# Patient Record
Sex: Female | Born: 1975 | Race: White | Hispanic: Yes | Marital: Married | Smoking: Never smoker
Health system: Southern US, Community
[De-identification: ages and names within clinical notes are randomized; demographics above are authoritative.]

---

## 2013-01-04 ENCOUNTER — Emergency Department: Payer: Self-pay | Admitting: Emergency Medicine

## 2014-09-01 HISTORY — PX: ANKLE FRACTURE SURGERY: SHX122

## 2017-02-18 ENCOUNTER — Encounter: Payer: Self-pay | Admitting: Certified Nurse Midwife

## 2017-02-19 ENCOUNTER — Encounter: Payer: Self-pay | Admitting: Certified Nurse Midwife

## 2017-08-05 ENCOUNTER — Ambulatory Visit: Payer: Self-pay | Admitting: Primary Care

## 2017-12-27 ENCOUNTER — Emergency Department
Admission: EM | Admit: 2017-12-27 | Discharge: 2017-12-27 | Disposition: A | Discharge status: Home / Self Care | Payer: BLUE CROSS/BLUE SHIELD | Attending: Emergency Medicine | Admitting: Emergency Medicine

## 2017-12-27 ENCOUNTER — Encounter: Payer: Self-pay | Admitting: Emergency Medicine

## 2017-12-27 ENCOUNTER — Other Ambulatory Visit: Payer: Self-pay

## 2017-12-27 ENCOUNTER — Emergency Department: Payer: BLUE CROSS/BLUE SHIELD

## 2017-12-27 DIAGNOSIS — Y929 Unspecified place or not applicable: Secondary | ICD-10-CM | POA: Diagnosis not present

## 2017-12-27 DIAGNOSIS — S0181XA Laceration without foreign body of other part of head, initial encounter: Secondary | ICD-10-CM | POA: Diagnosis not present

## 2017-12-27 DIAGNOSIS — S0990XA Unspecified injury of head, initial encounter: Secondary | ICD-10-CM | POA: Diagnosis present

## 2017-12-27 DIAGNOSIS — Y939 Activity, unspecified: Secondary | ICD-10-CM | POA: Diagnosis not present

## 2017-12-27 DIAGNOSIS — Y999 Unspecified external cause status: Secondary | ICD-10-CM | POA: Insufficient documentation

## 2017-12-27 DIAGNOSIS — W19XXXA Unspecified fall, initial encounter: Secondary | ICD-10-CM | POA: Insufficient documentation

## 2017-12-27 LAB — CBC
HEMATOCRIT: 35.8 % (ref 35.0–47.0)
Hemoglobin: 11.9 g/dL — ABNORMAL LOW (ref 12.0–16.0)
MCH: 28.9 pg (ref 26.0–34.0)
MCHC: 33.3 g/dL (ref 32.0–36.0)
MCV: 86.7 fL (ref 80.0–100.0)
Platelets: 280 10*3/uL (ref 150–440)
RBC: 4.13 MIL/uL (ref 3.80–5.20)
RDW: 16.9 % — ABNORMAL HIGH (ref 11.5–14.5)
WBC: 6.5 10*3/uL (ref 3.6–11.0)

## 2017-12-27 LAB — COMPREHENSIVE METABOLIC PANEL
ALT: 27 U/L (ref 14–54)
AST: 31 U/L (ref 15–41)
Albumin: 4.6 g/dL (ref 3.5–5.0)
Alkaline Phosphatase: 24 U/L — ABNORMAL LOW (ref 38–126)
Anion gap: 9 (ref 5–15)
BUN: 8 mg/dL (ref 6–20)
CALCIUM: 9.5 mg/dL (ref 8.9–10.3)
CO2: 23 mmol/L (ref 22–32)
CREATININE: 0.63 mg/dL (ref 0.44–1.00)
Chloride: 107 mmol/L (ref 101–111)
GFR calc non Af Amer: >60 mL/min (ref >60)
GLUCOSE: 109 mg/dL — AB (ref 65–99)
Potassium: 3.9 mmol/L (ref 3.5–5.1)
SODIUM: 139 mmol/L (ref 135–145)
Total Bilirubin: 0.4 mg/dL (ref 0.3–1.2)
Total Protein: 7.9 g/dL (ref 6.5–8.1)

## 2017-12-27 LAB — ETHANOL: Alcohol, Ethyl (B): 318 mg/dL (ref <10)

## 2017-12-27 MED ORDER — LIDOCAINE-EPINEPHRINE (PF) 1 %-1:200000 IJ SOLN
INTRAMUSCULAR | Status: AC
  Administered 2017-12-27: 30 mL
  Filled 2017-12-27: qty 30

## 2017-12-27 MED ORDER — ACETAMINOPHEN 500 MG PO TABS
1000.0000 mg | ORAL_TABLET | Freq: Once | ORAL | Status: AC
Start: 2017-12-27 — End: 2017-12-27
  Administered 2017-12-27: 1000 mg via ORAL
  Filled 2017-12-27: qty 2

## 2017-12-27 MED ORDER — SODIUM CHLORIDE 0.9 % IV BOLUS
1000.0000 mL | Freq: Once | INTRAVENOUS | Status: AC
  Administered 2017-12-27: 1000 mL via INTRAVENOUS

## 2017-12-27 MED ORDER — BACITRACIN ZINC 500 UNIT/GM EX OINT
TOPICAL_OINTMENT | Freq: Once | CUTANEOUS | Status: AC
  Administered 2017-12-27: 1 via TOPICAL
  Filled 2017-12-27: qty 0.9

## 2017-12-27 NOTE — ED Notes (Signed)
Patient transported to CT 

## 2017-12-27 NOTE — ED Notes (Signed)
Laurie Buchanan, sig other, given earrings and NCDL

## 2017-12-27 NOTE — Discharge Instructions (Signed)
Please follow up with your physician or return to have your sutures removed in 5 days. Please take tylenol or ibuprofen for pain and please refrain from excessive drinking
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
## 2017-12-27 NOTE — ED Triage Notes (Signed)
Patient presents to Emergency Department via EMS with complaints of head laceration.    Pt reports having "a lot" of ETOH tonight.    Pt with severe lac to right mid forehead - approx 12x5cm, bleeding controlled and bandaged.

## 2017-12-27 NOTE — ED Notes (Signed)
Pt ambulated out of department with friends. Refused wheelchair.

## 2018-08-30 IMAGING — CT CT CERVICAL SPINE W/O CM
4 of 7 series · 13 of 33 positions shown, 14 images · non-contrast
Comparison: None.

CLINICAL DATA: Laceration to the right mid forehead. Intoxicated.
Unsure of loss of consciousness or how patient fell.

EXAM:
CT HEAD WITHOUT CONTRAST
CT CERVICAL SPINE WITHOUT CONTRAST
TECHNIQUE: Multidetector CT imaging of the head and cervical spine was
performed following the standard protocol without intravenous
contrast. Multiplanar CT image reconstructions of the cervical spine
were also generated.

[Series 7: c spine soft · axial · 0.32mm/px · z∈[-257,-145]mm · 4 of 94 slices shown]
[im 19/94  soft-tissue]
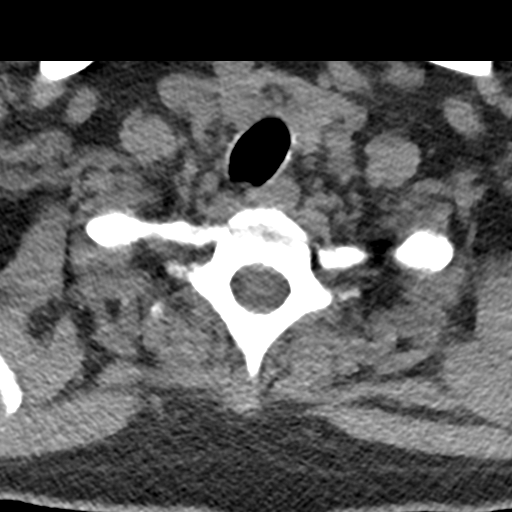
[im 38/94  soft-tissue]
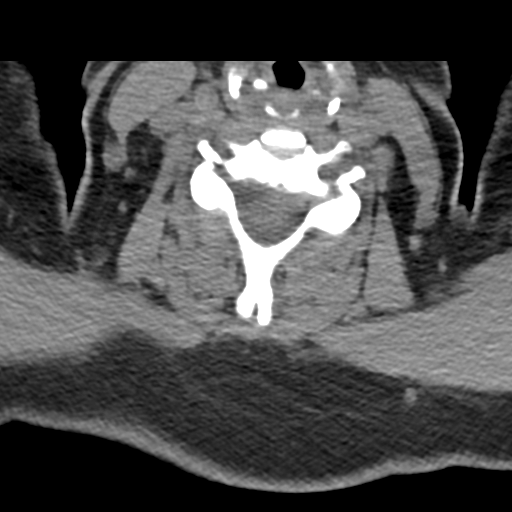
[im 56/94  soft-tissue]
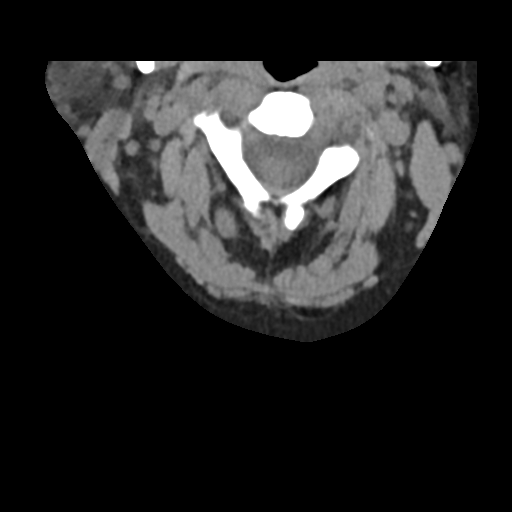
[im 75/94  soft-tissue]
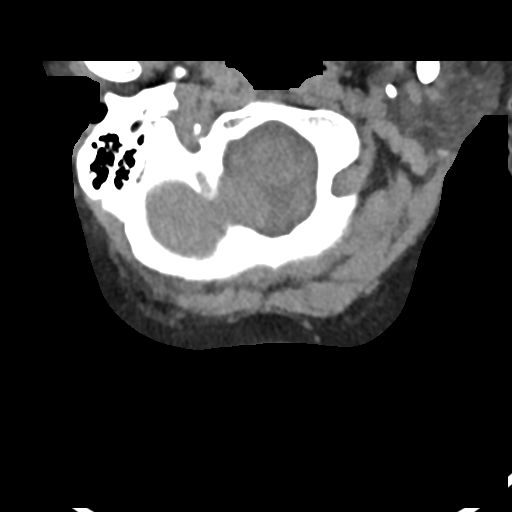

[Series 10: sagittal bone · sagittal · 0.22mm/px · 4 of 46 slices shown]
[im 10/46  bone]
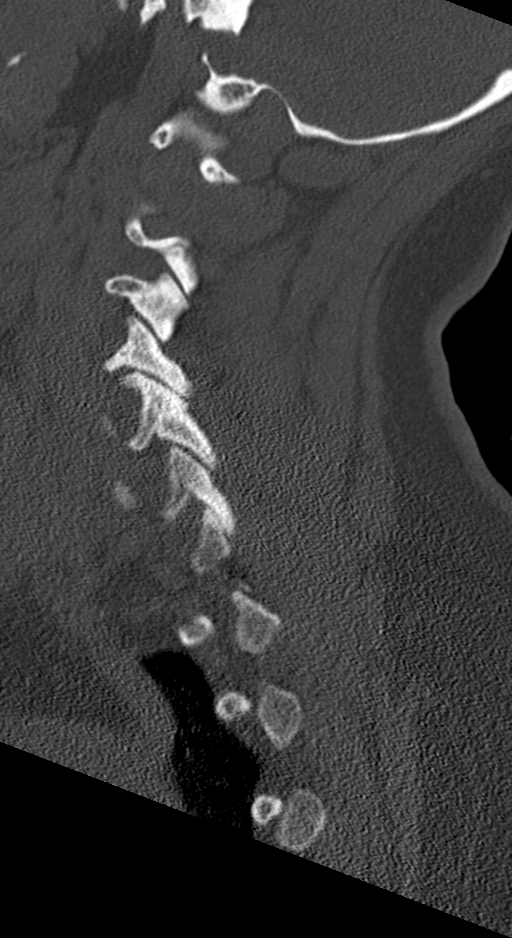
[im 19/46  bone]
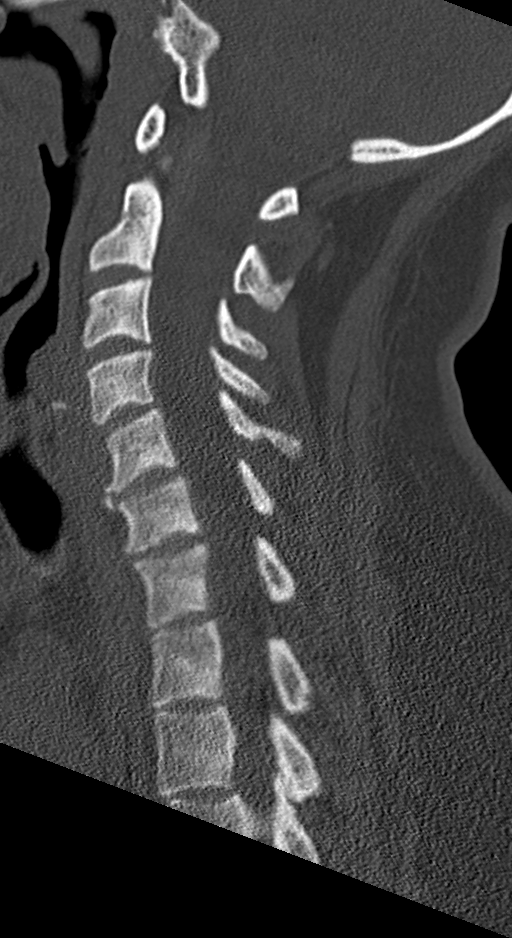
[im 28/46  bone]
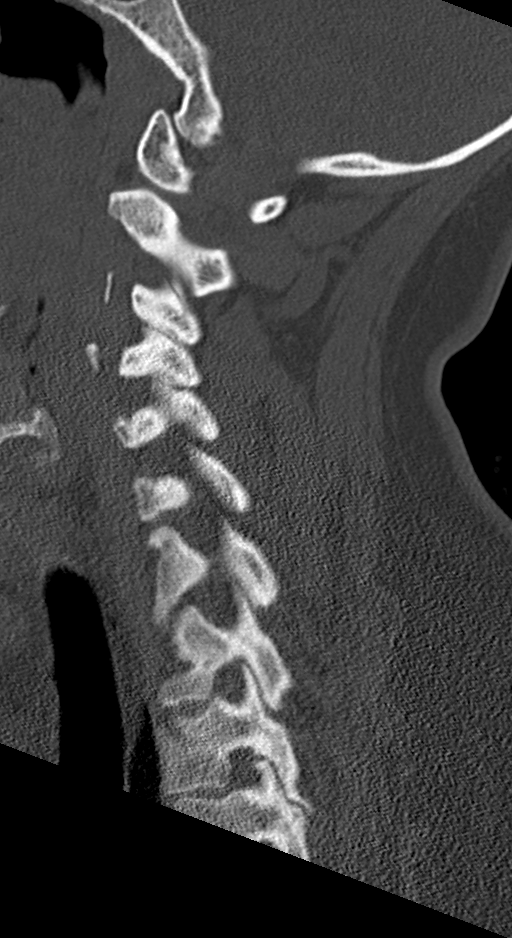
[im 37/46  bone]
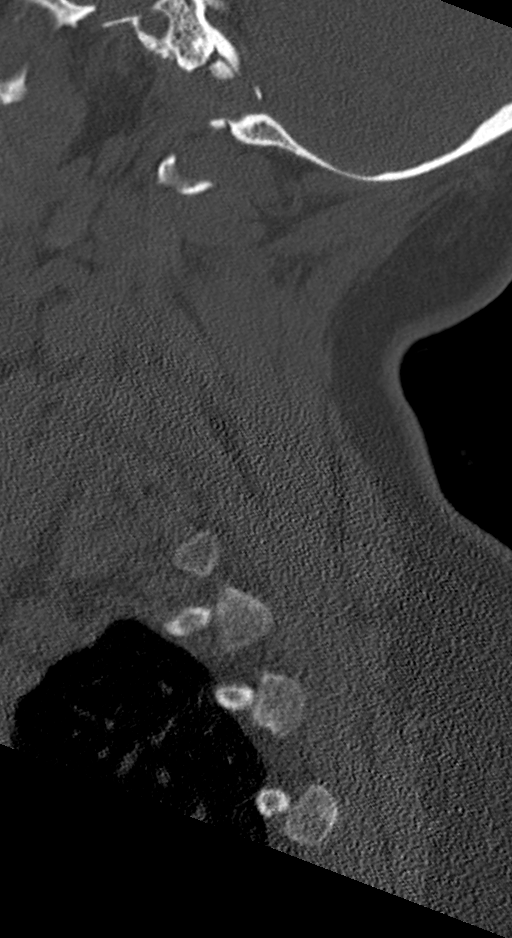

[Series 11: coronal bone · coronal · 0.26mm/px · 1 of 49 slices shown]
[im 25/49  bone]
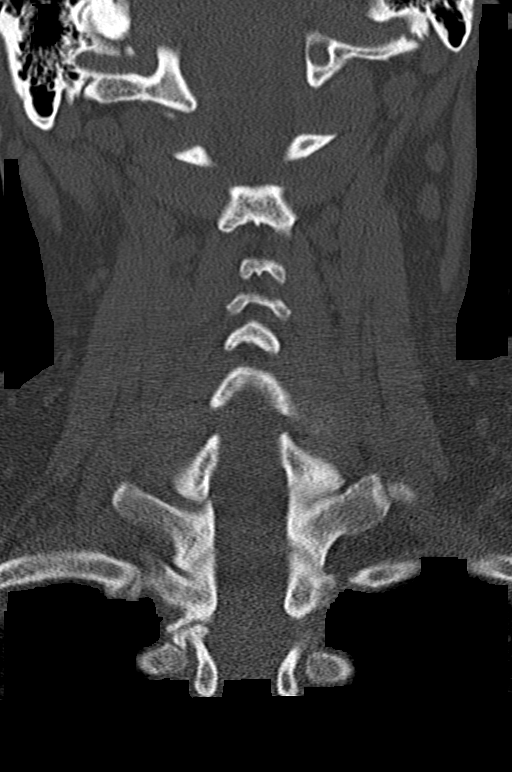

[Series 12: orthogonal bone · axial · 0.24mm/px · z∈[-297,-185]mm · 4 of 102 slices shown, 5 images]
[im 21/102  soft-tissue]
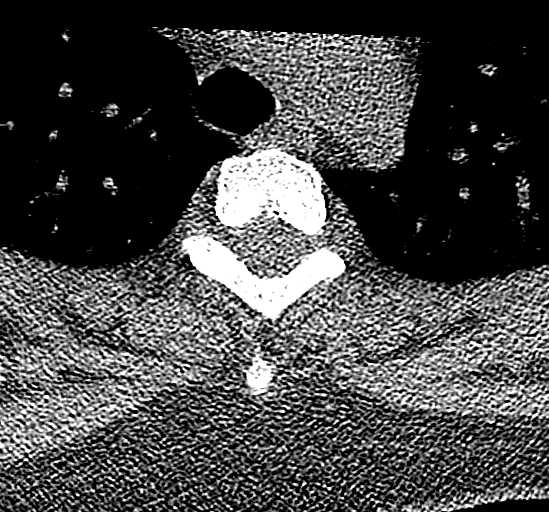
[im 21/102  bone]
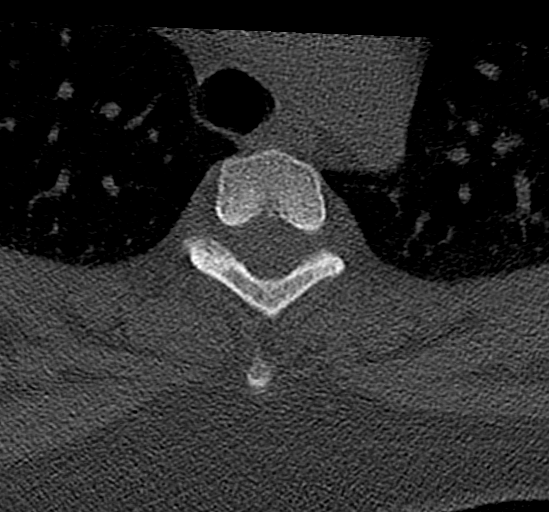
[im 41/102  bone]
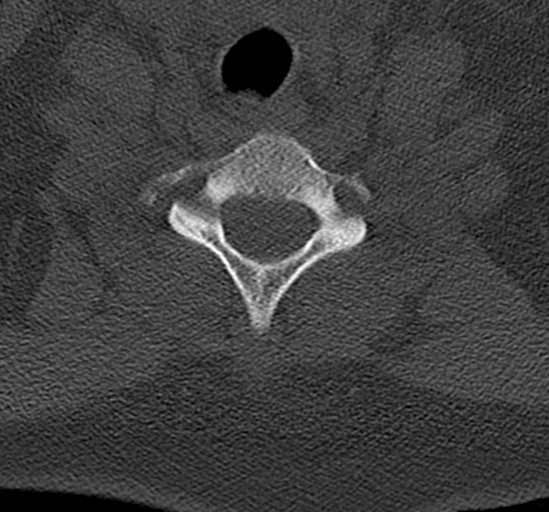
[im 61/102  bone]
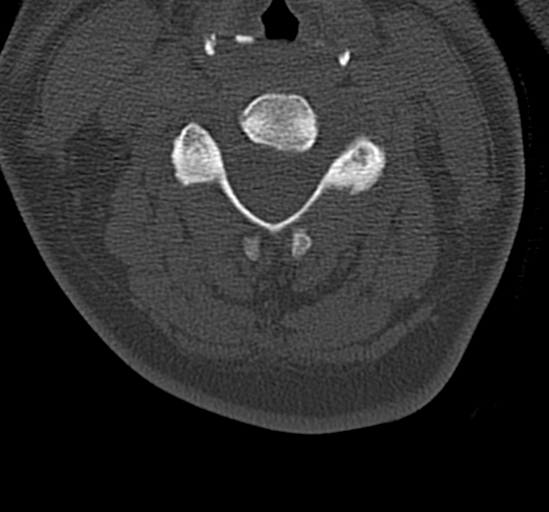
[im 81/102  bone]
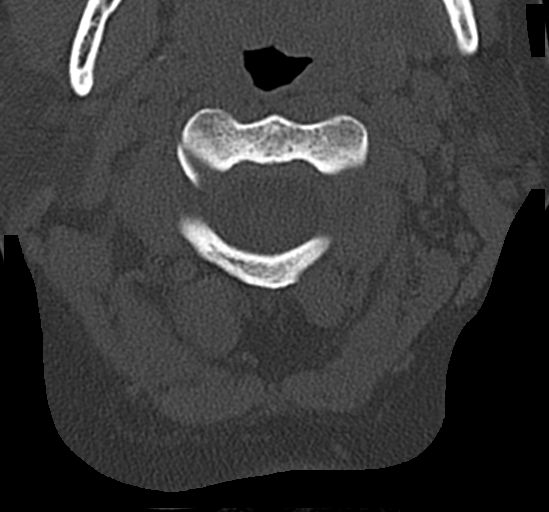

[13 of 33 positions shown; findings below may reference images not displayed]

FINDINGS: CT HEAD FINDINGS

Brain: No evidence of acute infarction, hemorrhage, hydrocephalus,
extra-axial collection or mass lesion/mass effect.

Vascular: No hyperdense vessel or unexpected calcification.

Skull: Normal. Negative for fracture or focal lesion.

Sinuses/Orbits: Paranasal sinuses and mastoid air cells are clear.

Other: Laceration and hematoma in the scalp over the right anterior
frontal region and extending to the supraorbital region.
Subcutaneous emphysema in the supraorbital region with focal
extension to the extraconal superior orbit. No retrobulbar
involvement.

CT CERVICAL SPINE FINDINGS

Alignment: Normal.

Skull base and vertebrae: No acute fracture. No primary bone lesion
or focal pathologic process.

Soft tissues and spinal canal: No prevertebral fluid or swelling. No
visible canal hematoma.

Disc levels: Degenerative changes with narrowed interspace levels
and endplate hypertrophic changes most prominent at C3-4 and C5-6.
Mild degenerative changes in the facet joints.

Upper chest: Negative.

Other: None.
IMPRESSION: 1. No acute intracranial abnormalities.
2. Laceration and hematoma in the right anterior frontal scalp
extending to the supraorbital region. Subcutaneous emphysema in the
supraorbital region with focal extension to the extraconal superior
orbit. No retrobulbar involvement.
3. Normal alignment of the cervical spine. Mild degenerative
changes. No acute displaced fractures identified.

## 2019-04-21 ENCOUNTER — Other Ambulatory Visit: Payer: Self-pay

## 2019-04-21 DIAGNOSIS — Z20822 Contact with and (suspected) exposure to covid-19: Secondary | ICD-10-CM

## 2019-04-22 LAB — NOVEL CORONAVIRUS, NAA: SARS-CoV-2, NAA: NOT DETECTED

## 2019-11-11 ENCOUNTER — Ambulatory Visit: Payer: Self-pay | Attending: Internal Medicine

## 2019-11-11 DIAGNOSIS — Z23 Encounter for immunization: Secondary | ICD-10-CM

## 2019-11-11 NOTE — Progress Notes (Signed)
   Covid-19 Vaccination Clinic  Name:  Laurie Buchanan    MRN: [id number] DOB: 05/09/1976  11/11/2019  Laurie Buchanan was observed post Covid-19 immunization for 15 minutes without incident. She was provided with Vaccine Information Sheet and instruction to access the V-Safe system.   Laurie Buchanan was instructed to call 911 with any severe reactions post vaccine: Marland Kitchen Difficulty breathing  . Swelling of face and throat  . A fast heartbeat  . A bad rash all over body  . Dizziness and weakness   Immunizations Administered    Name Date Dose VIS Date Route   Moderna COVID-19 Vaccine 11/11/2019  2:59 AM 0.5 mL 08/02/2019 Intramuscular   Manufacturer: Moderna   Lot: [id number]   NDC: 96789-381-01

## 2019-12-13 ENCOUNTER — Ambulatory Visit: Payer: Self-pay | Attending: Internal Medicine

## 2019-12-13 ENCOUNTER — Ambulatory Visit: Payer: Self-pay

## 2019-12-13 DIAGNOSIS — Z23 Encounter for immunization: Secondary | ICD-10-CM

## 2019-12-13 NOTE — Progress Notes (Signed)
   Covid-19 Vaccination Clinic  Name:  Laurie Buchanan    MRN: [id number] DOB: August 05, 1976  12/13/2019  [name] was observed post Covid-19 immunization for 15 minutes without incident. She was provided with Vaccine Information Sheet and instruction to access the V-Safe system.   [name] was instructed to call 911 with any severe reactions post vaccine: Marland Kitchen Difficulty breathing  . Swelling of face and throat  . A fast heartbeat  . A bad rash all over body  . Dizziness and weakness   Immunizations Administered    Name Date Dose VIS Date Route   Moderna COVID-19 Vaccine 12/13/2019  2:03 PM 0.5 mL 08/02/2019 Intramuscular   Manufacturer: Moderna   Lot: [id number]   NDC: 79810-254-86

## 2019-12-25 ENCOUNTER — Emergency Department: Payer: Self-pay

## 2019-12-25 ENCOUNTER — Emergency Department
Admission: EM | Admit: 2019-12-25 | Discharge: 2019-12-25 | Disposition: A | Discharge status: Home / Self Care | Payer: Self-pay | Attending: Emergency Medicine | Admitting: Emergency Medicine

## 2019-12-25 ENCOUNTER — Other Ambulatory Visit: Payer: Self-pay

## 2019-12-25 DIAGNOSIS — M79662 Pain in left lower leg: Secondary | ICD-10-CM | POA: Insufficient documentation

## 2019-12-25 DIAGNOSIS — L52 Erythema nodosum: Secondary | ICD-10-CM | POA: Insufficient documentation

## 2019-12-25 MED ORDER — NAPROXEN 500 MG PO TABS: 500.0000 mg | ORAL_TABLET | Freq: Two times a day (BID) | ORAL | 2 refills | Status: DC

## 2019-12-25 NOTE — ED Provider Notes (Signed)
[facility] Emergency Department Provider Note   ____________________________________________    I have reviewed the triage vital signs and the nursing notes.   HISTORY  Chief Complaint Leg Pain     HPI Laurie Buchanan is a 44 y.o. female who presents with bilateral painful red areas to her anterior lower legs.  Patient reports this started several days ago.  She reports she injured her shin nearly a week ago.  She did get a second of her Covid vaccines 2 weeks ago.  No new medications.  She describes that her legs are slightly uncomfortable to walk on but when she gets going they feel better.  She has not take anything for this.  No fevers or chills.  Was seen in urgent care, sent to the emergency department for evaluation of possible blood clots.  History reviewed. No pertinent past medical history.  There are no problems to display for this patient.   Past Surgical History:  Procedure Laterality Date  . ANKLE FRACTURE SURGERY Right 2016    Prior to Admission medications   Medication Sig Start Date End Date Taking? Authorizing Provider  naproxen (NAPROSYN) 500 MG tablet Take 1 tablet (500 mg total) by mouth 2 (two) times daily with a meal. 12/25/19   Jene Every, MD     Allergies Patient has no known allergies.  No family history on file.  Social History Social History   Tobacco Use  . Smoking status: Never Smoker  . Smokeless tobacco: Never Used  Substance Use Topics  . Alcohol use: Yes  . Drug use: Not Currently    Review of Systems  Constitutional: No fever/chills  ENT: No sore throat.   Gastrointestinal: No abdominal pain.  No nausea, no vomiting.   Genitourinary: Negative for dysuria. Musculoskeletal: As above Skin: As above Neurological: Negative for headaches     ____________________________________________   PHYSICAL EXAM:  VITAL SIGNS: ED Triage Vitals  Enc Vitals Group     BP 12/25/19 1156 (!) 143/91      Pulse Rate 12/25/19 1156 97     Resp 12/25/19 1156 16     Temp 12/25/19 1156 98.3 F (36.8 C)     Temp Source 12/25/19 1156 Oral     SpO2 12/25/19 1156 99 %     Weight 12/25/19 1147 95.3 kg (210 lb)     Height 12/25/19 1147 1.651 m (5\' 5" )     Head Circumference --      Peak Flow --      Pain Score 12/25/19 1146 7     Pain Loc --      Pain Edu? --      Excl. in GC? --      Constitutional: Alert and oriented. No acute distress. Eyes: Conjunctivae are normal.  Head: Atraumatic. Nose: No congestion/rhinnorhea. Mouth/Throat: Mucous membranes are moist.   Cardiovascular: Normal rate, regular rhythm.  Respiratory: Normal respiratory effort.  No retractions. Genitourinary: deferred Musculoskeletal: Anterior shins bilaterally: Several erythematous circular appearing lesions, not confluence, not consistent with cellulitis, mildly tender.  Mild diffuse swelling of the calf.  Warm and well perfused Neurologic:  Normal speech and language. No gross focal neurologic deficits are appreciated.   Skin:  Skin is warm, dry and intact.  See above   ____________________________________________   LABS (all labs ordered are listed, but only abnormal results are displayed)  Labs Reviewed - No data to display ____________________________________________  EKG   ____________________________________________  RADIOLOGY  Ultrasound negative for  DVT ____________________________________________   PROCEDURES  Procedure(s) performed: No  Procedures   Critical Care performed: No ____________________________________________   INITIAL IMPRESSION / ASSESSMENT AND PLAN / ED COURSE  Pertinent labs & imaging results that were available during my care of the patient were reviewed by me and considered in my medical decision making (see chart for details).  Patient's exam is most consistent with erythema nodosum.  Considered cellulitis however not confluent and bilateral nature makes it very  unlikely.  Patient is afebrile.  Recommend supportive care primarily, have asked the patient to follow-up with PCP for further evaluation.  Return to the emergency department if worsening symptoms.   ____________________________________________   FINAL CLINICAL IMPRESSION(S) / ED DIAGNOSES  Final diagnoses:  Erythema nodosum      NEW MEDICATIONS STARTED DURING THIS VISIT:  There are no discharge medications for this patient.    Note:  This document was prepared using Dragon voice recognition software and may include unintentional dictation errors.   Lavonia Drafts, MD 12/25/19 1453

## 2019-12-25 NOTE — ED Notes (Signed)
See triage note, pt to ED for pain/redness to bilateral lower extremities since Friday. Reports going to urgent care today and being sent to ED.  Redness and heat noted upon palpitation to BLE .  Reports increased pain with ambulation

## 2019-12-25 NOTE — ED Triage Notes (Signed)
Pt reports that she was sent here by urgent care for redness/swelling/warmth to bilat lower extremities x1 week that has increasingly gotten worse over the past 2 days

## 2019-12-25 NOTE — ED Notes (Signed)
Pt signed paper copy for d/c. Denies questions or concerns at this time  

## 2020-08-27 IMAGING — US US EXTREM LOW VENOUS
1 series · 14 of 24 positions shown · non-contrast
Comparison: None

CLINICAL DATA: Bilateral leg pain and swelling.

EXAM:
Bilateral LOWER EXTREMITY VENOUS DOPPLER ULTRASOUND
TECHNIQUE: Gray-scale sonography with compression, as well as color and duplex
ultrasound, were performed to evaluate the deep venous system(s)
from the level of the common femoral vein through the popliteal and
proximal calf veins.

[Series 1: us extrem low venous · 0.09mm/px · 14 of 62 slices shown]
[im 1/62]
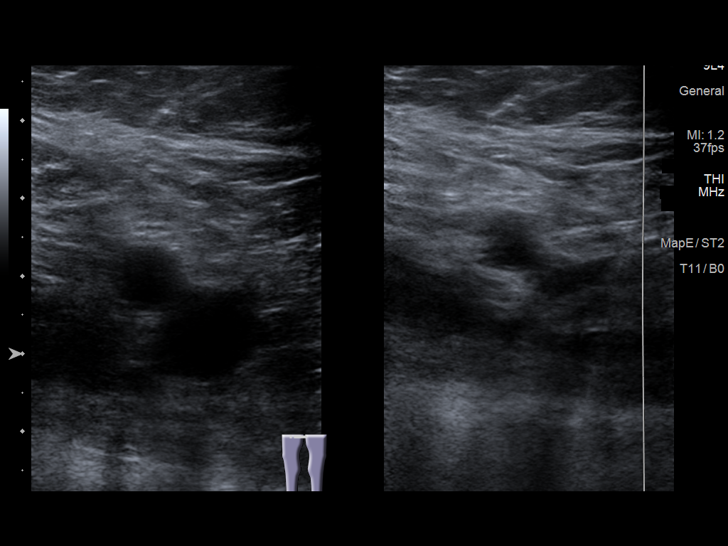
[im 6/62]
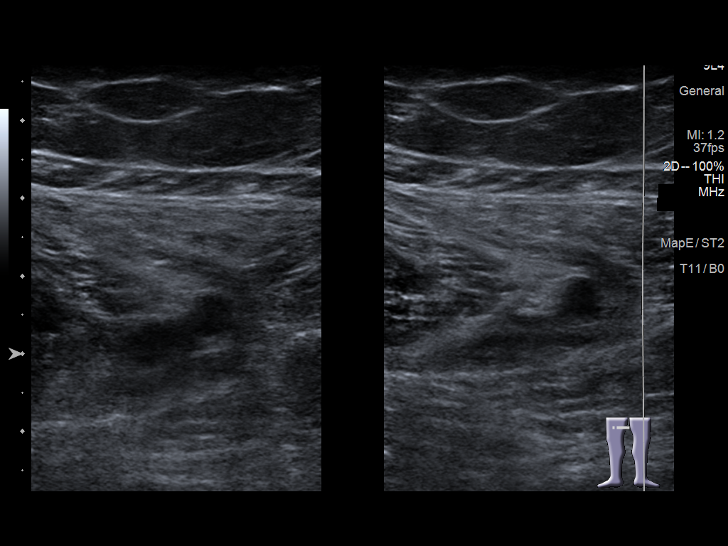
[im 11/62]
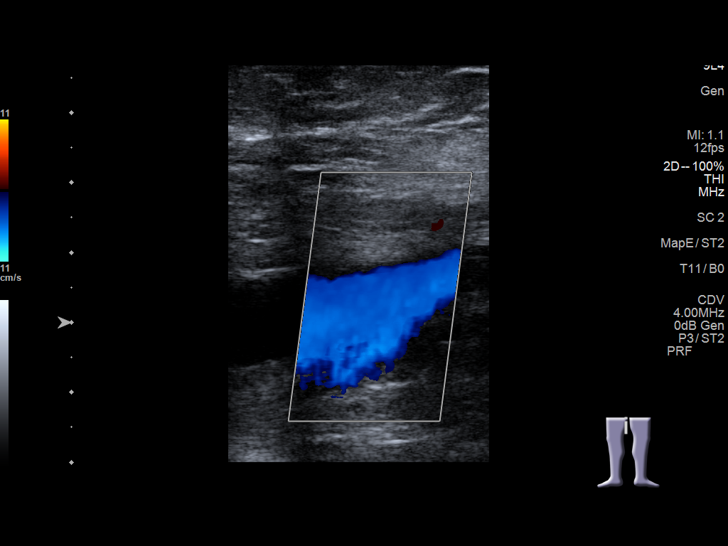
[im 16/62]
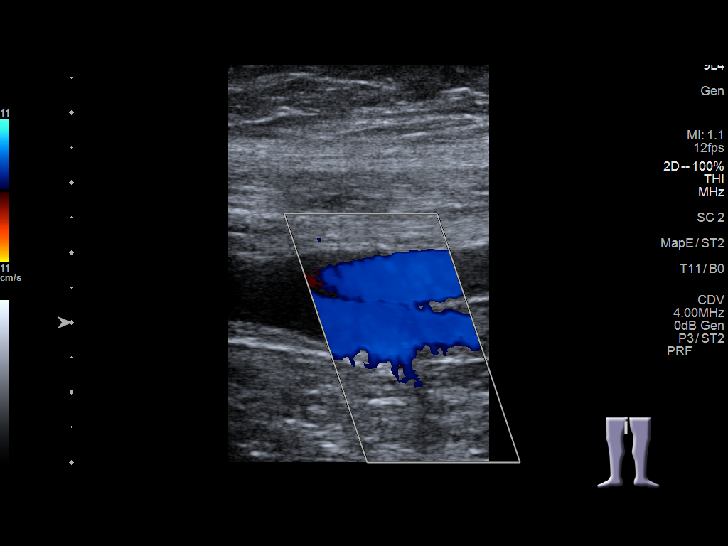
[im 19/62]
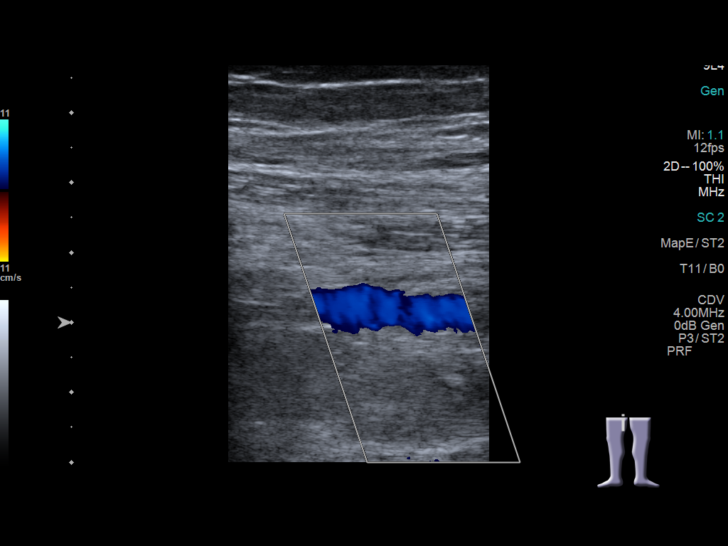
[im 24/62]
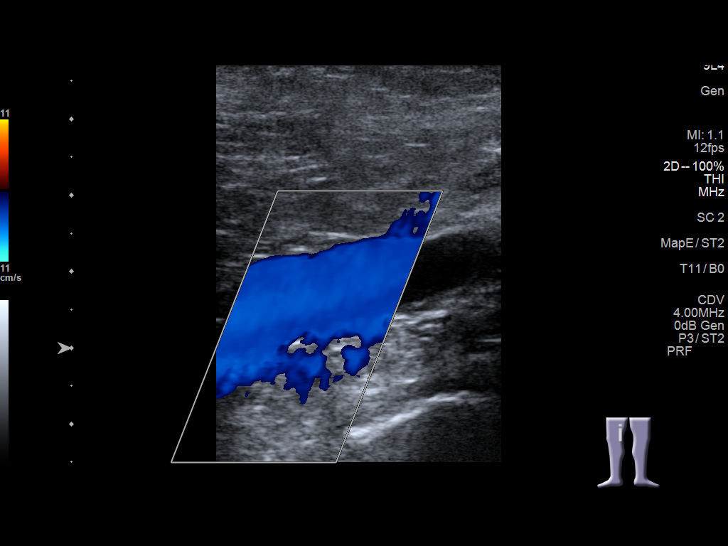
[im 30/62]
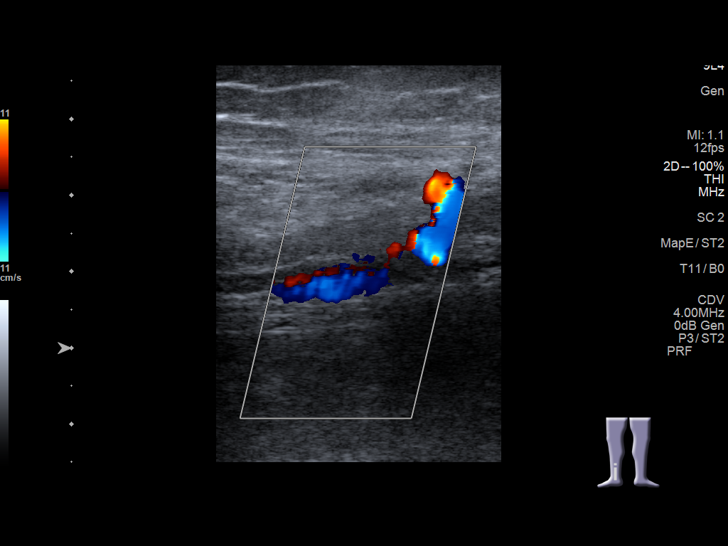
[im 32/62]
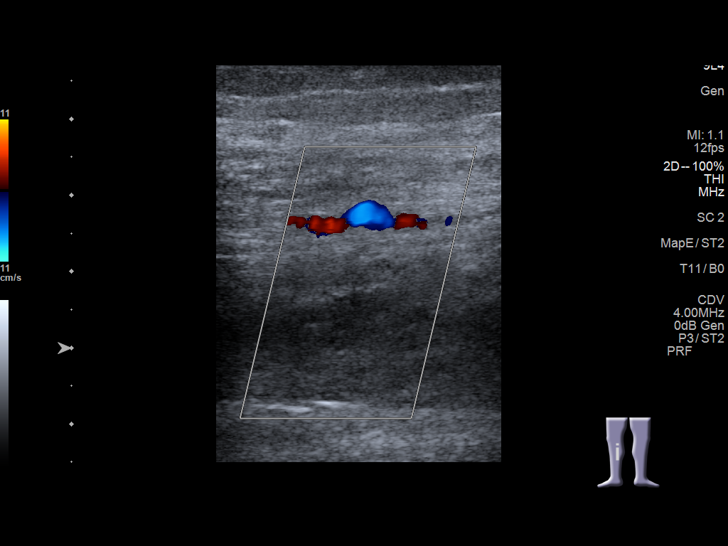
[im 38/62]
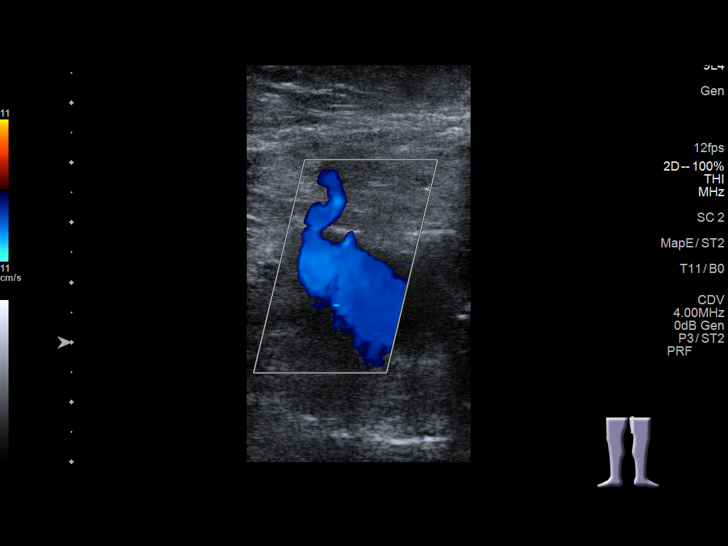
[im 43/62]
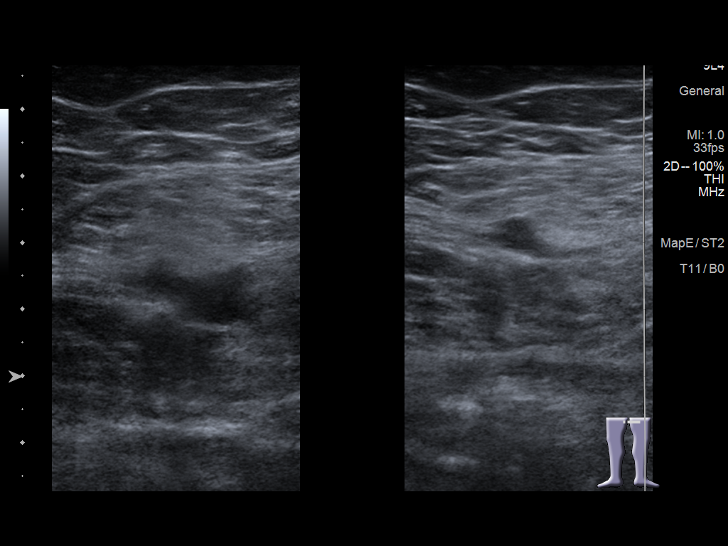
[im 48/62]
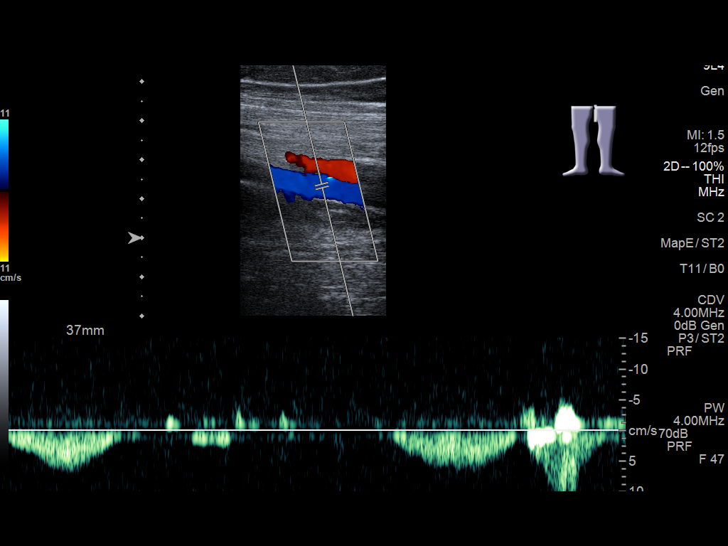
[im 51/62]
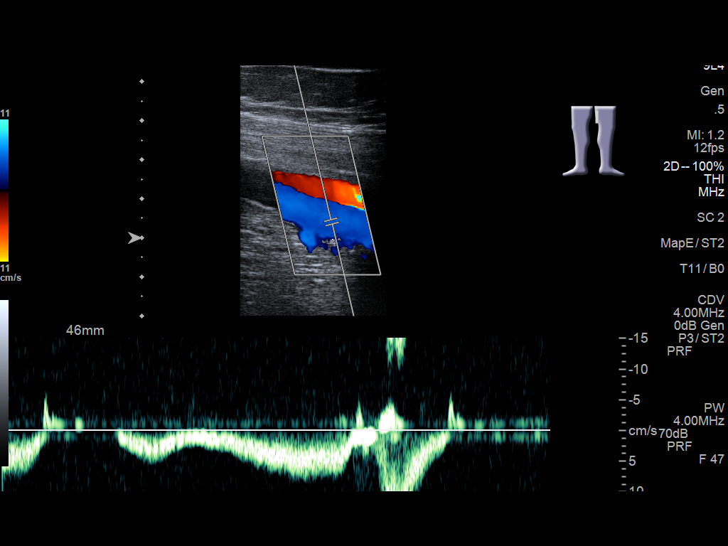
[im 56/62]
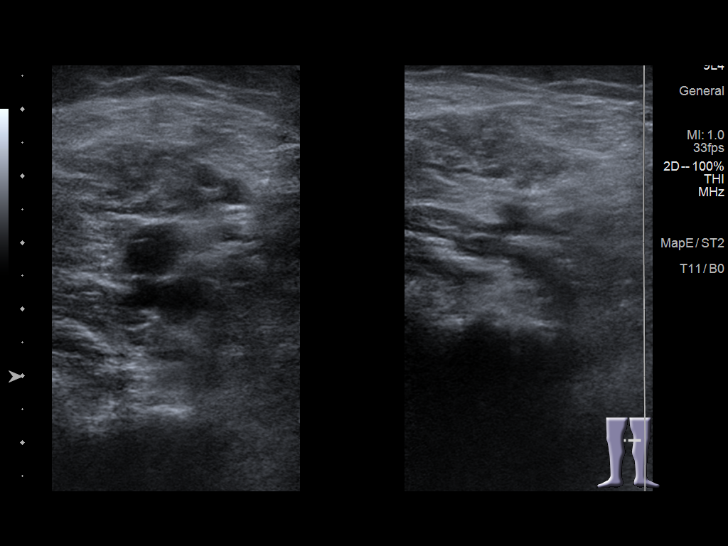
[im 62/62]
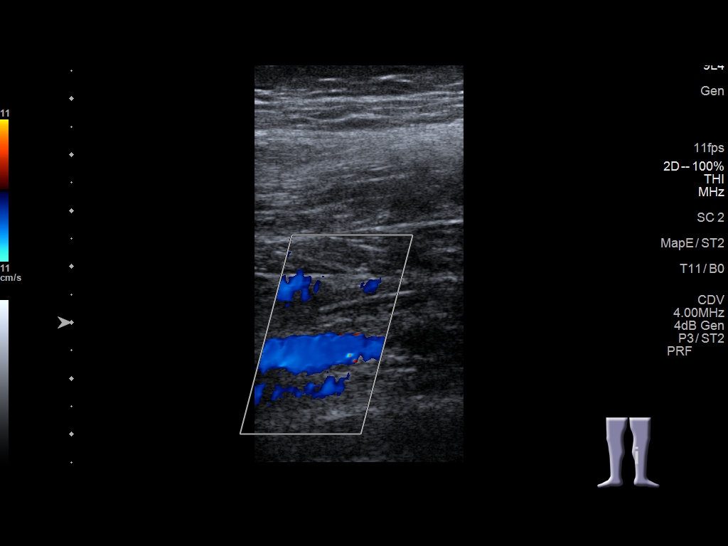

[14 of 24 positions shown; findings below may reference images not displayed]

FINDINGS: VENOUS

Normal compressibility of the common femoral, superficial femoral,
and popliteal veins, as well as the visualized calf veins.
Visualized portions of profunda femoral vein and great saphenous
vein unremarkable. No filling defects to suggest DVT on grayscale or
color Doppler imaging. Doppler waveforms show normal direction of
venous flow, normal respiratory phasicity and response to
augmentation.

OTHER

Lower extremity edema.

Sonographic assessment limited by the extent of edema.

Limitations: none
IMPRESSION: No femoropopliteal DVT nor evidence of DVT within the visualized
calf veins.

If clinical symptoms are inconsistent or if there are persistent or
worsening symptoms, further imaging (possibly involving the iliac
veins) may be warranted.

## 2021-11-13 DIAGNOSIS — J029 Acute pharyngitis, unspecified: Secondary | ICD-10-CM | POA: Diagnosis not present

## 2021-11-13 DIAGNOSIS — J309 Allergic rhinitis, unspecified: Secondary | ICD-10-CM | POA: Diagnosis not present

## 2022-05-16 ENCOUNTER — Emergency Department: Payer: 59

## 2022-05-16 ENCOUNTER — Other Ambulatory Visit: Payer: Self-pay

## 2022-05-16 ENCOUNTER — Emergency Department
Admission: EM | Admit: 2022-05-16 | Discharge: 2022-05-16 | Disposition: A | Discharge status: Home / Self Care | Payer: 59 | Attending: Student in an Organized Health Care Education/Training Program | Admitting: Student in an Organized Health Care Education/Training Program

## 2022-05-16 DIAGNOSIS — R519 Headache, unspecified: Secondary | ICD-10-CM | POA: Insufficient documentation

## 2022-05-16 DIAGNOSIS — S20212A Contusion of left front wall of thorax, initial encounter: Secondary | ICD-10-CM

## 2022-05-16 DIAGNOSIS — M5010 Cervical disc disorder with radiculopathy, unspecified cervical region: Secondary | ICD-10-CM | POA: Diagnosis not present

## 2022-05-16 DIAGNOSIS — Y92410 Unspecified street and highway as the place of occurrence of the external cause: Secondary | ICD-10-CM | POA: Insufficient documentation

## 2022-05-16 DIAGNOSIS — S299XXA Unspecified injury of thorax, initial encounter: Secondary | ICD-10-CM | POA: Diagnosis not present

## 2022-05-16 DIAGNOSIS — Z041 Encounter for examination and observation following transport accident: Secondary | ICD-10-CM | POA: Diagnosis not present

## 2022-05-16 DIAGNOSIS — S0990XA Unspecified injury of head, initial encounter: Secondary | ICD-10-CM | POA: Diagnosis not present

## 2022-05-16 DIAGNOSIS — M47812 Spondylosis without myelopathy or radiculopathy, cervical region: Secondary | ICD-10-CM | POA: Diagnosis not present

## 2022-05-16 DIAGNOSIS — R079 Chest pain, unspecified: Secondary | ICD-10-CM | POA: Diagnosis not present

## 2022-05-16 MED ORDER — IBUPROFEN 600 MG PO TABS
600.0000 mg | ORAL_TABLET | Freq: Once | ORAL | Status: AC
  Administered 2022-05-16: 600 mg via ORAL
  Filled 2022-05-16: qty 1

## 2022-05-16 MED ORDER — LIDOCAINE 5 % EX PTCH
1.0000 | MEDICATED_PATCH | CUTANEOUS | Status: DC
  Administered 2022-05-16: 1 via TRANSDERMAL
  Filled 2022-05-16: qty 1

## 2022-05-16 MED ORDER — PREDNISONE 10 MG (21) PO TBPK: ORAL_TABLET | ORAL | 0 refills | Status: DC

## 2022-05-16 NOTE — Discharge Instructions (Signed)
You may take the medication as prescribed to help with your symptoms.  Please return if you develop numbness, tingling, weakness in your arms, or worsening pain.  Please follow-up with your outpatient provider.  It is a pleasure caring for you today.
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
## 2022-09-23 DIAGNOSIS — Z6834 Body mass index (BMI) 34.0-34.9, adult: Secondary | ICD-10-CM | POA: Diagnosis not present

## 2022-09-23 DIAGNOSIS — K130 Diseases of lips: Secondary | ICD-10-CM | POA: Diagnosis not present

## 2024-05-10 ENCOUNTER — Ambulatory Visit
Admission: EM | Admit: 2024-05-10 | Discharge: 2024-05-10 | Disposition: A | Discharge status: Home / Self Care | Attending: Emergency Medicine | Admitting: Emergency Medicine

## 2024-05-10 ENCOUNTER — Encounter: Payer: Self-pay | Admitting: Emergency Medicine

## 2024-05-10 DIAGNOSIS — R252 Cramp and spasm: Secondary | ICD-10-CM

## 2024-05-10 MED ORDER — BACLOFEN 5 MG PO TABS: 5.0000 mg | ORAL_TABLET | ORAL | 0 refills | Status: AC | PRN

## 2024-05-10 MED ORDER — PREDNISONE 10 MG (21) PO TBPK: ORAL_TABLET | Freq: Every day | ORAL | 0 refills | Status: AC

## 2024-05-10 NOTE — Discharge Instructions (Addendum)
 Today you are evaluated for your leg cramping  On exam there are no signs of a blood clot such as tightness, significant swelling, discoloration of the skin and pain  On exam I am able to feel a knot in the back of the calf muscle consistent with muscle tightness  Begin prednisone  every morning with food as directed to reduce inflammation and help with pain, avoid ibuprofen  while taking you may use Tylenol  or any topical medicines  May use muscle relaxant every 8 hours as needed for further comfort  May continue to elevate  May apply warm compresses to the affected area  May stretch the area if tolerable  Blood work has been obtained to check your electrolytes and you will be notified of any concerning values, pending for up to 3 days  If symptoms continue to persist or worsen may follow-up as needed

## 2024-05-10 NOTE — ED Provider Notes (Signed)
 [facility]    CSN: [id number] Arrival date & time: 05/10/24  1021      History   Chief Complaint Chief Complaint  Patient presents with   Spasms    HPI Laurie Buchanan is a 48 y.o. female.   Patient presents for evaluation of persisting cramping to the left calf beginning 5 days ago.  Started while sleeping, felt like a charley horse but has not fully resolved.  Pain intermittently radiates to the lateral aspect.  Denies swelling, redness, injury or trauma, numbness or tingling.  Able to complete full range of motion, symptoms are exacerbated during long periods of standing.  Denies activity changes.  History reviewed. No pertinent past medical history.  There are no active problems to display for this patient.   Past Surgical History:  Procedure Laterality Date   ANKLE FRACTURE SURGERY Right 2016    OB History   No obstetric history on file.      Home Medications    Prior to Admission medications   Medication Sig Start Date End Date Taking? Authorizing Provider  Baclofen  5 MG TABS Take 1 tablet (5 mg total) by mouth every three (3) days as needed for up to 15 doses. 05/10/24  Yes Pauline Trainer R, NP  predniSONE  (STERAPRED UNI-PAK 21 TAB) 10 MG (21) TBPK tablet Take by mouth daily. Take 6 tabs by mouth daily  for 1 days, then 5 tabs for 1 days, then 4 tabs for 1 days, then 3 tabs for 1 days, 2 tabs for 1 days, then 1 tab by mouth daily for 1 days 05/10/24  Yes Teresa Shelba SAUNDERS, NP    Family History History reviewed. No pertinent family history.  Social History Social History   Tobacco Use   Smoking status: Never   Smokeless tobacco: Never  Substance Use Topics   Alcohol use: Yes   Drug use: Not Currently     Allergies   Penicillins   Review of Systems Review of Systems   Physical Exam Triage Vital Signs ED Triage Vitals  Encounter Vitals Group     BP 05/10/24 1115 120/83     Girls Systolic BP Percentile --      Girls Diastolic BP  Percentile --      Boys Systolic BP Percentile --      Boys Diastolic BP Percentile --      Pulse Rate 05/10/24 1115 75     Resp 05/10/24 1115 18     Temp 05/10/24 1115 98.5 F (36.9 C)     Temp Source 05/10/24 1115 Oral     SpO2 05/10/24 1115 98 %     Weight --      Height --      Head Circumference --      Peak Flow --      Pain Score 05/10/24 1112 7     Pain Loc --      Pain Education --      Exclude from Growth Chart --    No data found.  Updated Vital Signs BP 120/83 (BP Location: Left Arm)   Pulse 75   Temp 98.5 F (36.9 C) (Oral)   Resp 18   LMP 04/12/2024 (Approximate)   SpO2 98%   Visual Acuity Right Eye Distance:   Left Eye Distance:   Bilateral Distance:    Right Eye Near:   Left Eye Near:    Bilateral Near:     Physical Exam Constitutional:  Appearance: Normal appearance.  Eyes:     Extraocular Movements: Extraocular movements intact.  Pulmonary:     Effort: Pulmonary effort is normal.  Musculoskeletal:       Legs:     Comments: Point tenderness to the center of the left calf muscle, firm but fluctuant knot, no erythema, mild generalized swelling, no discoloration of the skin, appropriate for ethnicity, 2+ popliteal pulse, able to bear weight and complete full range of motion,   Neurological:     Mental Status: She is alert and oriented to person, place, and time. Mental status is at baseline.      UC Treatments / Results  Labs (all labs ordered are listed, but only abnormal results are displayed) Labs Reviewed  BASIC METABOLIC PANEL WITH GFR  MAGNESIUM    EKG   Radiology No results found.  Procedures Procedures (including critical care time)  Medications Ordered in UC Medications - No data to display  Initial Impression / Assessment and Plan / UC Course  I have reviewed the triage vital signs and the nursing notes.  Pertinent labs & imaging results that were available during my care of the patient were reviewed by me and  considered in my medical decision making (see chart for details).  Leg cramping  Etiology most likely muscular, discussed, most consistent with a myofascial trigger point, low suspicion for thrombosis based on presentation, BMP and magnesium obtained, pending, will add supplements based on results, prescribed prednisone  and baclofen , recommended supportive care through [facility], heat massage stretching, given written handout of calf stretches and advised follow-up if symptoms continue to persist or worsen Final Clinical Impressions(s) / UC Diagnoses   Final diagnoses:  Leg cramping     Discharge Instructions      Today you are evaluated for your leg cramping  On exam there are no signs of a blood clot such as tightness, significant swelling, discoloration of the skin and pain  On exam I am able to feel a knot in the back of the calf muscle consistent with muscle tightness  Begin prednisone  every morning with food as directed to reduce inflammation and help with pain, avoid ibuprofen  while taking you may use Tylenol  or any topical medicines  May use muscle relaxant every 8 hours as needed for further comfort  May continue to elevate  May apply warm compresses to the affected area  May stretch the area if tolerable  Blood work has been obtained to check your electrolytes and you will be notified of any concerning values, pending for up to 3 days  If symptoms continue to persist or worsen may follow-up as needed   ED Prescriptions     Medication Sig Dispense Auth. Provider   predniSONE  (STERAPRED UNI-PAK 21 TAB) 10 MG (21) TBPK tablet Take by mouth daily. Take 6 tabs by mouth daily  for 1 days, then 5 tabs for 1 days, then 4 tabs for 1 days, then 3 tabs for 1 days, 2 tabs for 1 days, then 1 tab by mouth daily for 1 days 21 tablet Allayna Erlich R, NP   Baclofen  5 MG TABS Take 1 tablet (5 mg total) by mouth every three (3) days as needed for up to 15 doses. 15 tablet Kaida Games  R, NP      PDMP not reviewed this encounter.   Teresa Shelba SAUNDERS, NP 05/10/24 1220

## 2024-05-10 NOTE — ED Triage Notes (Signed)
 Patient complains of muscle cramps in left calf area x 5 days ago. Patient has not taken anything for symptoms. Patient denies injury. No redness to site noted. Rates pain 7/10.

## 2024-05-11 ENCOUNTER — Ambulatory Visit (HOSPITAL_COMMUNITY): Payer: Self-pay

## 2024-05-11 LAB — BASIC METABOLIC PANEL WITH GFR
BUN/Creatinine Ratio: 16 (ref 9–23)
BUN: 9 mg/dL (ref 6–24)
CO2: 20 mmol/L (ref 20–29)
Calcium: 9.6 mg/dL (ref 8.7–10.2)
Chloride: 104 mmol/L (ref 96–106)
Creatinine, Ser: 0.55 mg/dL — ABNORMAL LOW (ref 0.57–1.00)
Glucose: 89 mg/dL (ref 70–99)
Potassium: 4.6 mmol/L (ref 3.5–5.2)
Sodium: 138 mmol/L (ref 134–144)
eGFR: 114 mL/min/1.73 (ref >59)

## 2024-05-11 LAB — MAGNESIUM: Magnesium: 2.1 mg/dL (ref 1.6–2.3)
